# Patient Record
Sex: Male | Born: 1970 | Race: White | Hispanic: No | Marital: Single | Smoking: Current every day smoker
Health system: Southern US, Community
[De-identification: ages and names within clinical notes are randomized; demographics above are authoritative.]

## PROBLEM LIST (undated history)

## (undated) DIAGNOSIS — F410 Panic disorder [episodic paroxysmal anxiety] without agoraphobia: Secondary | ICD-10-CM

---

## 2014-05-06 ENCOUNTER — Emergency Department (HOSPITAL_BASED_OUTPATIENT_CLINIC_OR_DEPARTMENT_OTHER): Payer: Self-pay

## 2014-05-06 ENCOUNTER — Encounter (HOSPITAL_BASED_OUTPATIENT_CLINIC_OR_DEPARTMENT_OTHER): Payer: Self-pay | Admitting: Emergency Medicine

## 2014-05-06 ENCOUNTER — Emergency Department (HOSPITAL_BASED_OUTPATIENT_CLINIC_OR_DEPARTMENT_OTHER)
Admission: EM | Admit: 2014-05-06 | Discharge: 2014-05-06 | Disposition: A | Discharge status: Home / Self Care | Payer: Self-pay | Attending: Emergency Medicine | Admitting: Emergency Medicine

## 2014-05-06 DIAGNOSIS — K2980 Duodenitis without bleeding: Secondary | ICD-10-CM | POA: Insufficient documentation

## 2014-05-06 DIAGNOSIS — F411 Generalized anxiety disorder: Secondary | ICD-10-CM | POA: Insufficient documentation

## 2014-05-06 DIAGNOSIS — K2970 Gastritis, unspecified, without bleeding: Secondary | ICD-10-CM | POA: Insufficient documentation

## 2014-05-06 DIAGNOSIS — K2990 Gastroduodenitis, unspecified, without bleeding: Secondary | ICD-10-CM | POA: Insufficient documentation

## 2014-05-06 DIAGNOSIS — F17200 Nicotine dependence, unspecified, uncomplicated: Secondary | ICD-10-CM | POA: Insufficient documentation

## 2014-05-06 HISTORY — DX: Panic disorder (episodic paroxysmal anxiety): F41.0

## 2014-05-06 LAB — COMPREHENSIVE METABOLIC PANEL
ALT: 23 U/L (ref 0–53)
AST: 26 U/L (ref 0–37)
Albumin: 4.3 g/dL (ref 3.5–5.2)
Alkaline Phosphatase: 56 U/L (ref 39–117)
Anion gap: 16 — ABNORMAL HIGH (ref 5–15)
BUN: 13 mg/dL (ref 6–23)
CALCIUM: 9.8 mg/dL (ref 8.4–10.5)
CO2: 25 mEq/L (ref 19–32)
Chloride: 99 mEq/L (ref 96–112)
Creatinine, Ser: 1.2 mg/dL (ref 0.50–1.35)
GFR calc Af Amer: 85 mL/min — ABNORMAL LOW (ref >90)
GFR calc non Af Amer: 74 mL/min — ABNORMAL LOW (ref >90)
GLUCOSE: 113 mg/dL — AB (ref 70–99)
Potassium: 4.3 mEq/L (ref 3.7–5.3)
SODIUM: 140 meq/L (ref 137–147)
Total Bilirubin: 0.7 mg/dL (ref 0.3–1.2)
Total Protein: 7.5 g/dL (ref 6.0–8.3)

## 2014-05-06 LAB — CBC
HCT: 45 % (ref 39.0–52.0)
Hemoglobin: 15.5 g/dL (ref 13.0–17.0)
MCH: 33.5 pg (ref 26.0–34.0)
MCHC: 34.4 g/dL (ref 30.0–36.0)
MCV: 97.4 fL (ref 78.0–100.0)
PLATELETS: 230 10*3/uL (ref 150–400)
RBC: 4.62 MIL/uL (ref 4.22–5.81)
RDW: 13.2 % (ref 11.5–15.5)
WBC: 12.1 10*3/uL — AB (ref 4.0–10.5)

## 2014-05-06 LAB — TROPONIN I: Troponin I: <0.3 ng/mL (ref <0.30)

## 2014-05-06 LAB — D-DIMER, QUANTITATIVE: D-Dimer, Quant: <0.27 ug/mL-FEU (ref 0.00–0.48)

## 2014-05-06 MED ORDER — SODIUM CHLORIDE 0.9 % IV BOLUS (SEPSIS)
500.0000 mL | Freq: Once | INTRAVENOUS | Status: AC
  Administered 2014-05-06: 500 mL via INTRAVENOUS

## 2014-05-06 MED ORDER — KETOROLAC TROMETHAMINE 30 MG/ML IJ SOLN
30.0000 mg | Freq: Once | INTRAMUSCULAR | Status: AC
  Administered 2014-05-06: 30 mg via INTRAVENOUS
  Filled 2014-05-06: qty 1

## 2014-05-06 MED ORDER — OMEPRAZOLE 20 MG PO CPDR: 20.0000 mg | DELAYED_RELEASE_CAPSULE | Freq: Every day | ORAL | Status: AC

## 2014-05-06 MED ORDER — GI COCKTAIL ~~LOC~~
30.0000 mL | Freq: Once | ORAL | Status: AC
  Administered 2014-05-06: 30 mL via ORAL
  Filled 2014-05-06: qty 30

## 2014-05-06 NOTE — Discharge Instructions (Signed)
Gastritis, Adult °Gastritis is soreness and puffiness (inflammation) of the lining of the stomach. If you do not get help, gastritis can cause bleeding and sores (ulcers) in the stomach. °HOME CARE  °· Only take medicine as told by your doctor. °· If you were given antibiotic medicines, take them as told. Finish the medicines even if you start to feel better. °· Drink enough fluids to keep your pee (urine) clear or pale yellow. °· Avoid foods and drinks that make your problems worse. Foods you may want to avoid include: °¨ Caffeine or alcohol. °¨ Chocolate. °¨ Mint. °¨ Garlic and onions. °¨ Spicy foods. °¨ Citrus fruits, including oranges, lemons, or limes. °¨ Food containing tomatoes, including sauce, chili, salsa, and pizza. °¨ Fried and fatty foods. °· Eat small meals throughout the day instead of large meals. °GET HELP RIGHT AWAY IF:  °· You have black or dark red poop (stools). °· You throw up (vomit) blood. It may look like coffee grounds. °· You cannot keep fluids down. °· Your belly (abdominal) pain gets worse. °· You have a fever. °· You do not feel better after 1 week. °· You have any other questions or concerns. °MAKE SURE YOU:  °· Understand these instructions. °· Will watch your condition. °· Will get help right away if you are not doing well or get worse. °Document Released: 04/05/2008 Document Revised: 01/10/2012 Document Reviewed: 12/01/2011 °[employer]® Patient Information ©2015 [employer]. This information is not intended to replace advice given to you by your health care provider. Make sure you discuss any questions you have with your health care provider. ° °

## 2014-05-06 NOTE — ED Notes (Signed)
Returned from xray

## 2014-05-06 NOTE — ED Provider Notes (Signed)
CSN: [id number]     Arrival date & time 05/06/14  0230 History   First MD Initiated Contact with Patient 05/06/14 0243     Chief Complaint  Patient presents with  . Chest Pain     (Consider location/radiation/quality/duration/timing/severity/associated sxs/prior Treatment) Patient is a 43 y.o. male presenting with chest pain. The history is provided by the patient.  Chest Pain Pain location:  L chest Pain quality: dull and sharp   Pain radiates to:  Does not radiate Pain radiates to the back: no   Pain severity:  Moderate Onset quality:  Gradual Timing:  Constant Progression:  Unchanged Chronicity:  New Context: at rest   Relieved by:  Nothing Worsened by:  Nothing tried Associated symptoms: anxiety   Associated symptoms: no abdominal pain, no back pain, no claudication, no dizziness, no heartburn, no lower extremity edema, no numbness, no palpitations, no shortness of breath and not vomiting   Risk factors: smoking   Risk factors: no aortic disease   Risk factors comment:  Alcohol at least 4 a day   Past Medical History  Diagnosis Date  . Panic attack    History reviewed. No pertinent past surgical history. History reviewed. No pertinent family history. History  Substance Use Topics  . Smoking status: Current Every Day Smoker -- 2.00 packs/day  . Smokeless tobacco: Not on file  . Alcohol Use: Yes     Comment: daily    Review of Systems  Respiratory: Negative for shortness of breath.   Cardiovascular: Positive for chest pain. Negative for palpitations, claudication and leg swelling.  Gastrointestinal: Negative for heartburn, vomiting and abdominal pain.  Musculoskeletal: Negative for back pain.  Neurological: Negative for dizziness and numbness.  Psychiatric/Behavioral: The patient is nervous/anxious.   All other systems reviewed and are negative.     Allergies  Review of patient's allergies indicates no known allergies.  Home Medications   Prior to  Admission medications   Not on File   BP 132/77  Pulse 76  Temp(Src) 98.2 F (36.8 C) (Oral)  Resp 18  SpO2 100% Physical Exam  Constitutional: He is oriented to person, place, and time. He appears well-developed and well-nourished. No distress.  HENT:  Head: Normocephalic and atraumatic.  Mouth/Throat: Oropharynx is clear and moist.  Eyes: Conjunctivae are normal. Pupils are equal, round, and reactive to light.  Neck: Normal range of motion. Neck supple.  Cardiovascular: Normal rate, regular rhythm and intact distal pulses.   Pulmonary/Chest: Effort normal and breath sounds normal. He has no wheezes. He has no rales.  Abdominal: Soft. Bowel sounds are increased. There is no tenderness. There is no rebound, no guarding, no tenderness at McBurney's point and negative Murphy's sign.  Musculoskeletal: Normal range of motion.  Neurological: He is alert and oriented to person, place, and time.  Skin: Skin is warm and dry.  Psychiatric: His mood appears anxious.    ED Course  Procedures (including critical care time) Labs Review Labs Reviewed  COMPREHENSIVE METABOLIC PANEL - Abnormal; Notable for the following:    Glucose, Bld 113 (*)    GFR calc non Af Amer 74 (*)    GFR calc Af Amer 85 (*)    Anion gap 16 (*)    All other components within normal limits  CBC - Abnormal; Notable for the following:    WBC 12.1 (*)    All other components within normal limits  TROPONIN I  D-DIMER, QUANTITATIVE    Imaging Review Dg Chest 2 View  05/06/2014   CLINICAL DATA:  Tightness in the chest. Shortness of breath and nausea.  EXAM: CHEST  2 VIEW  COMPARISON:  None.  FINDINGS: The heart size and mediastinal contours are within normal limits. Both lungs are clear. The visualized skeletal structures are unremarkable.  IMPRESSION: No active cardiopulmonary disease.   Electronically Signed   By: Burman NievesWilliam  Stevens M.D.   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  Negative DDimer and 2 negative troponins with a negative EKG excluded ACS.  Will start prilosec and no ETOH       Audris Speaker K Gini Caputo-Rasch, MD 05/06/14 [phone]

## 2014-05-06 NOTE — ED Notes (Signed)
Tightness in chest after lying down to sleep, + sob, + nausea

## 2014-05-06 NOTE — ED Notes (Signed)
Transported to xray 

## 2014-05-06 NOTE — ED Notes (Signed)
Resting quietly. Remains sinus on monitor

## 2014-11-25 IMAGING — CR DG CHEST 2V
2 series · 2 of 2 positions shown · non-contrast
Comparison: None.

CLINICAL DATA: Tightness in the chest. Shortness of breath and
nausea.

EXAM:
CHEST  2 VIEW

[w chest pa]
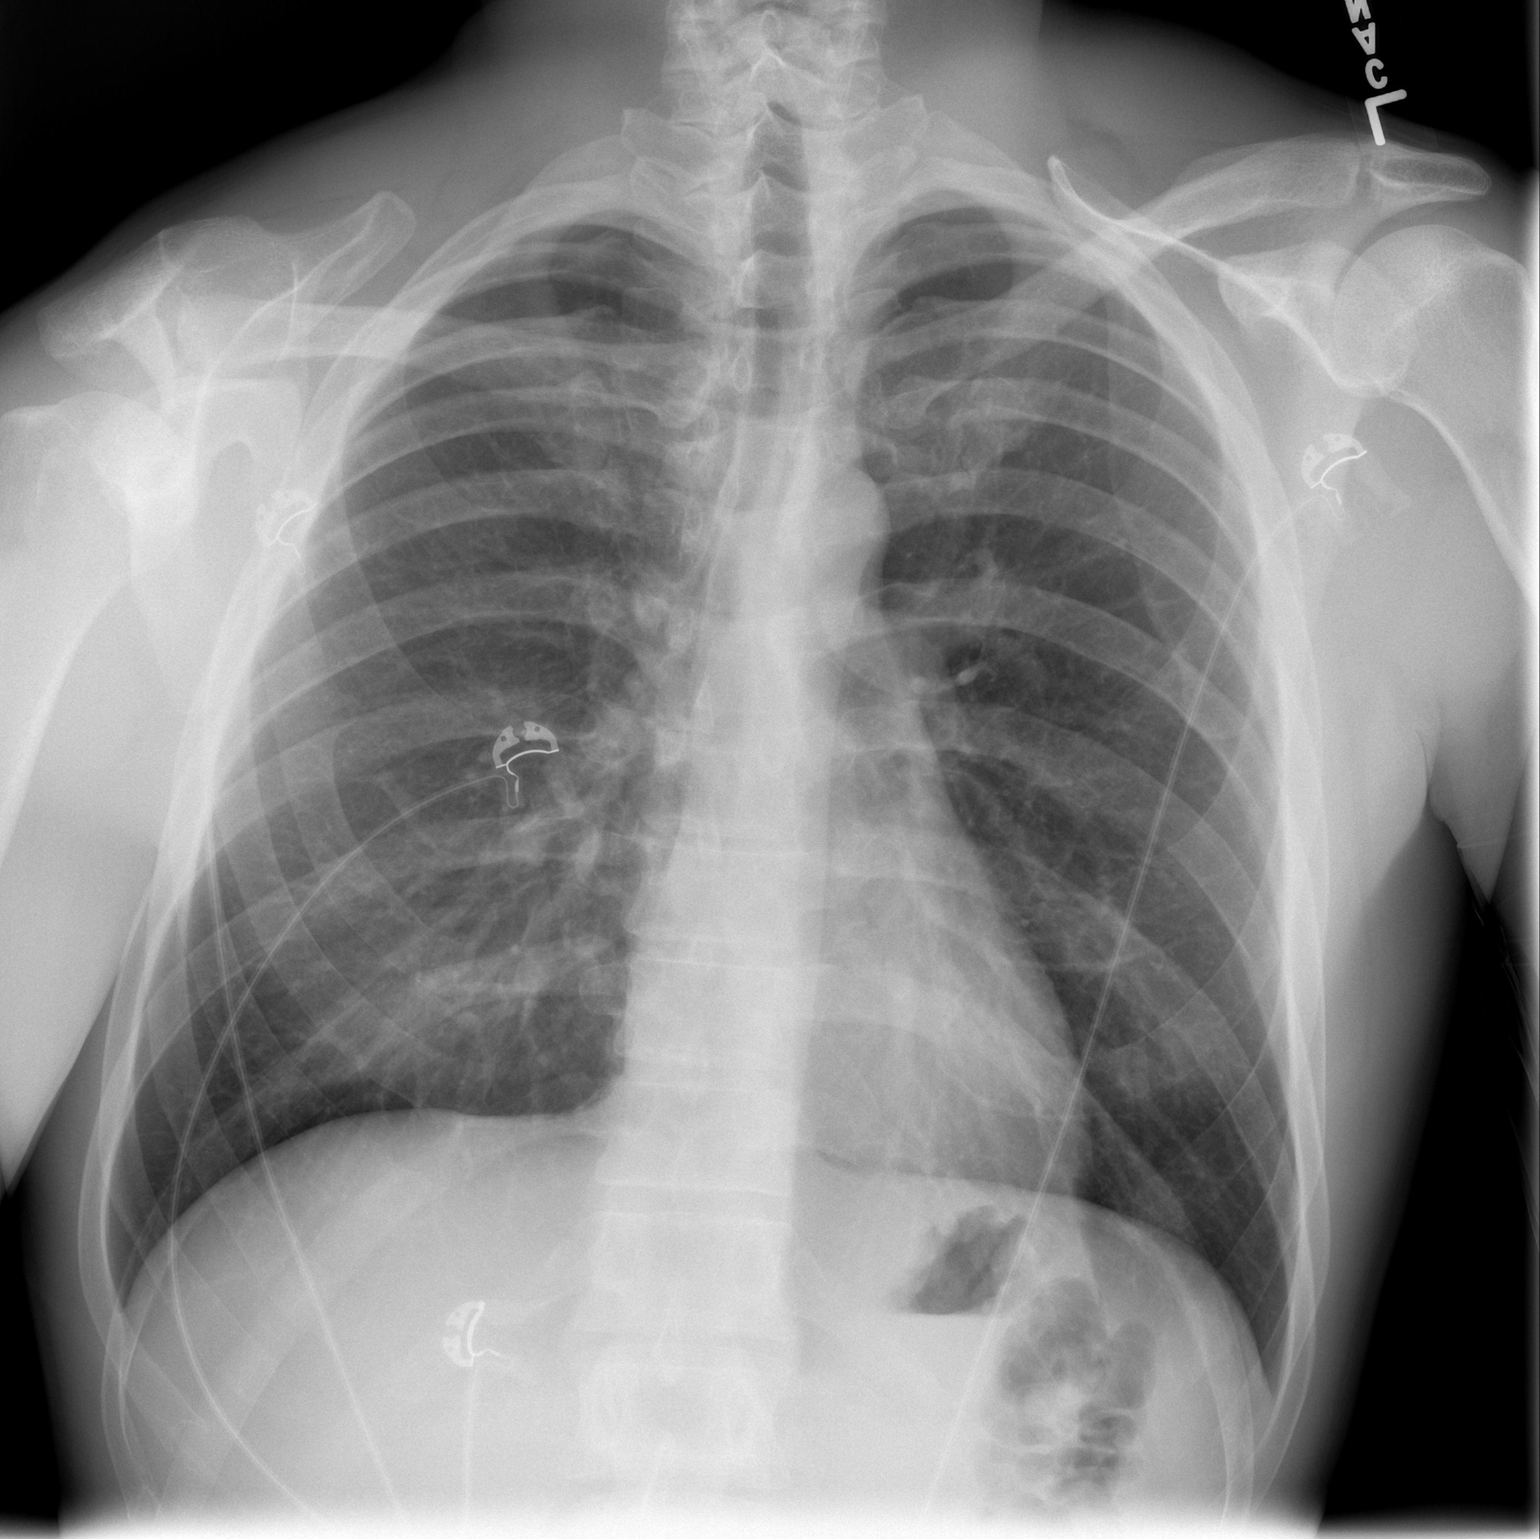

[w chest lat]
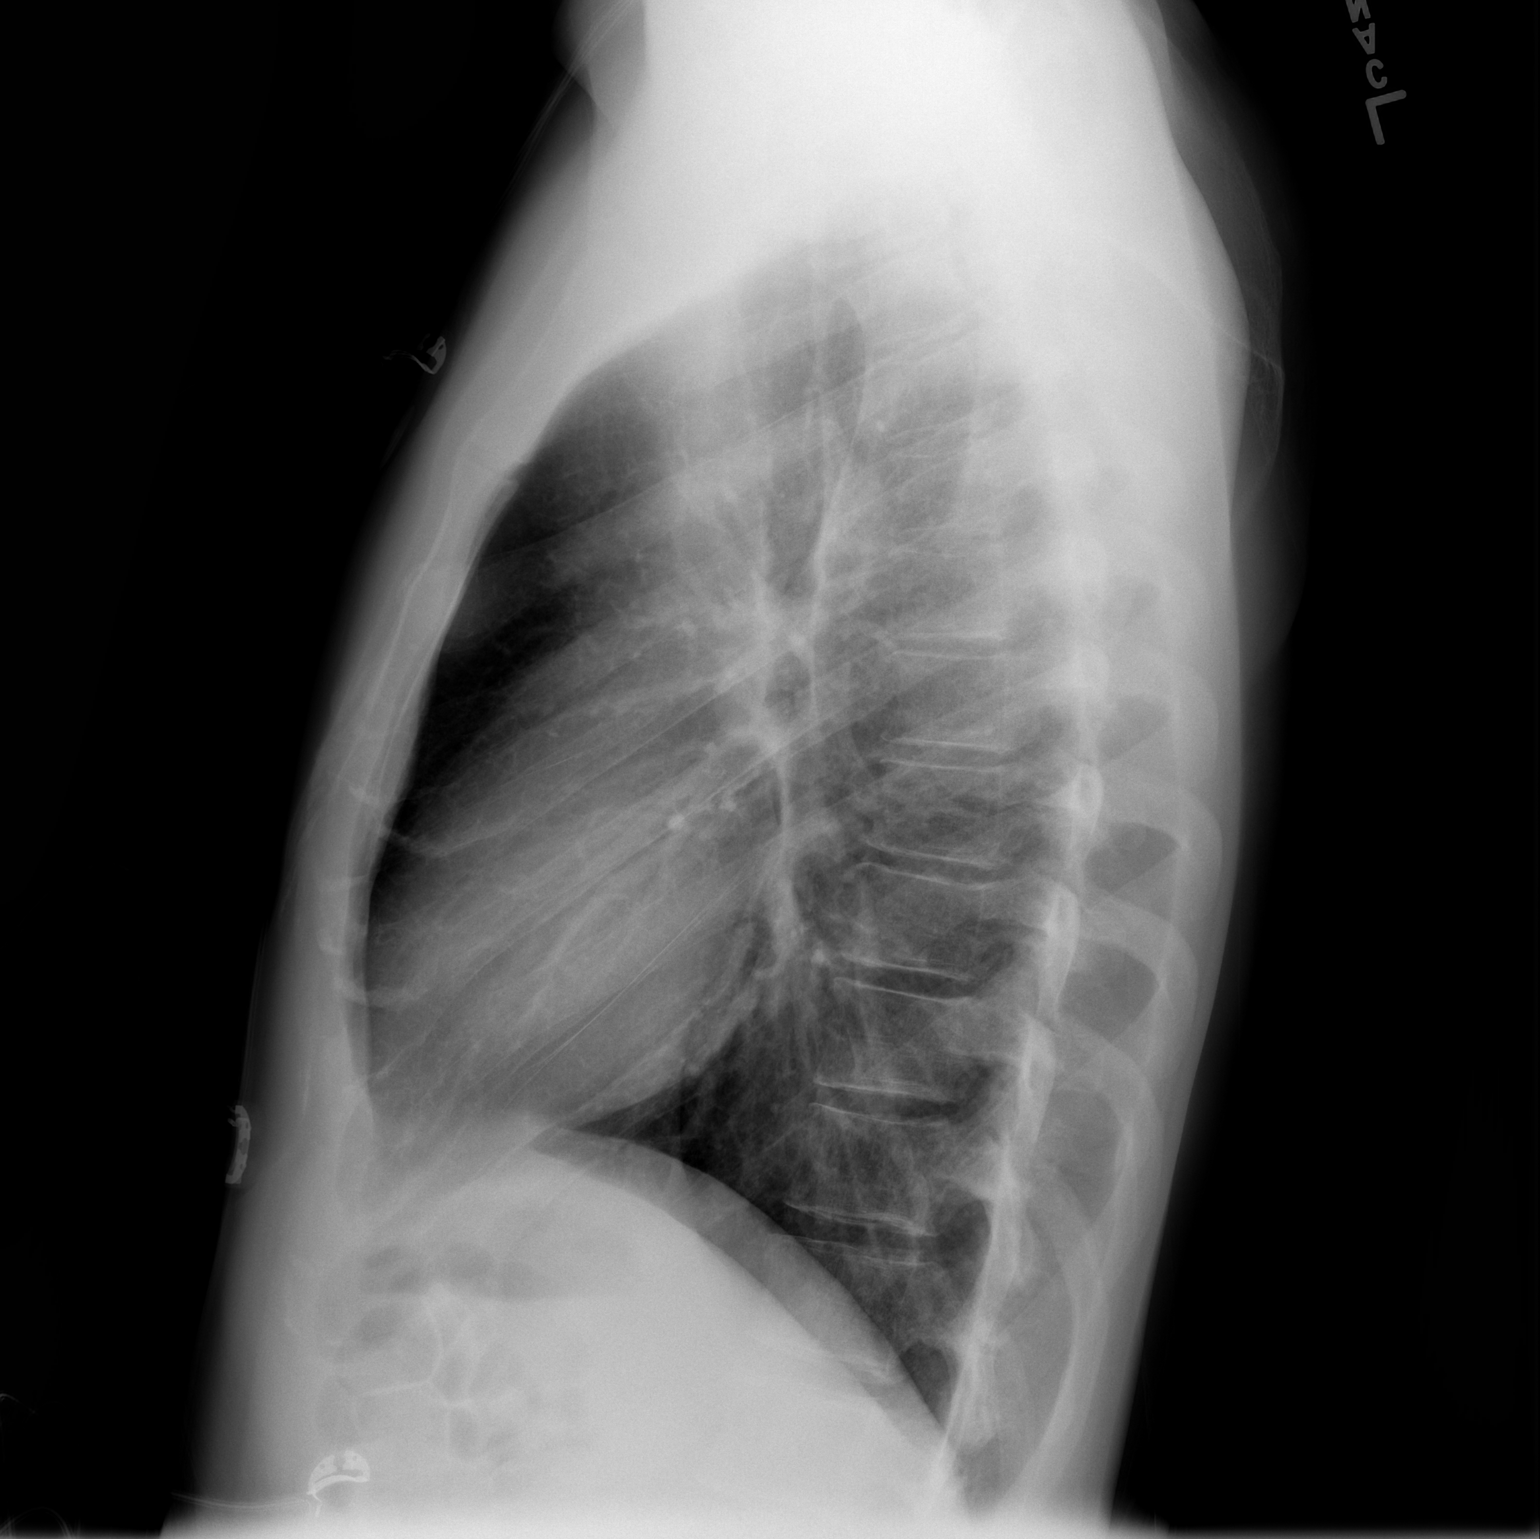

[2 of 2 positions shown; findings below may reference images not displayed]

FINDINGS: The heart size and mediastinal contours are within normal limits.
Both lungs are clear. The visualized skeletal structures are
unremarkable.
IMPRESSION: No active cardiopulmonary disease.
